# Patient Record
Sex: Female | Born: 1980 | Hispanic: Yes | Marital: Married | Smoking: Never smoker
Health system: Southern US, Community
[De-identification: ages and names within clinical notes are randomized; demographics above are authoritative.]

---

## 2004-11-02 ENCOUNTER — Ambulatory Visit: Payer: Self-pay | Admitting: Family Medicine

## 2005-06-24 ENCOUNTER — Inpatient Hospital Stay: Payer: Self-pay | Admitting: Obstetrics and Gynecology

## 2011-10-13 ENCOUNTER — Ambulatory Visit: Payer: Self-pay | Admitting: Family Medicine

## 2012-03-06 ENCOUNTER — Inpatient Hospital Stay: Payer: Self-pay

## 2012-03-06 LAB — CBC WITH DIFFERENTIAL/PLATELET
Basophil #: 0 10*3/uL (ref 0.0–0.1)
Basophil %: 0.1 %
Lymphocyte #: 1.5 10*3/uL (ref 1.0–3.6)
Lymphocyte %: 20.2 %
MCH: 31.6 pg (ref 26.0–34.0)
MCV: 94 fL (ref 80–100)
Monocyte #: 0.6 x10 3/mm (ref 0.2–0.9)
Monocyte %: 7.4 %
Neutrophil %: 71.8 %
Platelet: 194 10*3/uL (ref 150–440)
RBC: 3.69 10*6/uL — ABNORMAL LOW (ref 3.80–5.20)
RDW: 13.4 % (ref 11.5–14.5)

## 2015-01-26 NOTE — H&P (Signed)
L&D Evaluation:  History:   HPI 34 y/o G3P2002 @ 39+ [facility] 03/09/12 arrives with contractions becoming more regular this afternoon.Denies leaking fluid, small bloody show, baby moves well. Well pregnancy, care @ [facility] GBS negative.    Presents with contractions    Patient's Medical History No Chronic Illness    Patient's Surgical History none    Medications Pre Natal Vitamins    Allergies NKDA    Social History none    Family History Non-Contributory   ROS:   ROS All systems were reviewed.  HEENT, CNS, GI, GU, Respiratory, CV, Renal and Musculoskeletal systems were found to be normal.   Exam:   Vital Signs stable    Urine Protein not completed    General no apparent distress    Mental Status clear    Chest clear    Heart normal sinus rhythm    Abdomen gravid, non-tender    Estimated Fetal Weight Average for gestational age    Fetal Position vtx    Fundal Height term    Back no CVAT    Edema no edema    Reflexes 1+    Pelvic no external lesions, 5cm 50% vtx @ -3 BOWI bloody show present    Mebranes Intact    FHT normal rate with no decels, 130's 140's avg variabillity with accels    Fetal Heart Rate 136    Ucx regular, q 3/4 mins 60 sec moderate    Skin dry    Lymph no lymphadenopathy   Impression:   Impression early labor   Plan:   Plan monitor contractions and for cervical change    Comments Smiling through uc's, plans IV pain meds with labor progress, declines epidural.Knows what to expect 3rd baby. VSS AF FHR reactive. Ambulated x 1 hr, wishes to continue to ambulate as long as comfortable. Partner at bedside supportive.   Electronic Signatures: Rosie Fate (CNM)  (Signed 19-Jun-13 20:30)  Authored: L&D Evaluation   Last Updated: 19-Jun-13 20:30 by Rosie Fate (CNM)

## 2019-06-26 ENCOUNTER — Other Ambulatory Visit: Payer: Self-pay | Admitting: Otolaryngology

## 2019-06-26 DIAGNOSIS — H449 Unspecified disorder of globe: Secondary | ICD-10-CM

## 2019-06-27 ENCOUNTER — Other Ambulatory Visit: Payer: Self-pay | Admitting: Otolaryngology

## 2019-06-27 DIAGNOSIS — R0989 Other specified symptoms and signs involving the circulatory and respiratory systems: Secondary | ICD-10-CM

## 2019-07-02 ENCOUNTER — Other Ambulatory Visit: Payer: Self-pay

## 2019-07-02 ENCOUNTER — Ambulatory Visit
Admission: RE | Admit: 2019-07-02 | Discharge: 2019-07-02 | Disposition: A | Discharge status: Home / Self Care | Payer: BLUE CROSS/BLUE SHIELD | Source: Ambulatory Visit | Attending: Otolaryngology | Admitting: Otolaryngology

## 2019-07-02 DIAGNOSIS — R0989 Other specified symptoms and signs involving the circulatory and respiratory systems: Secondary | ICD-10-CM

## 2019-07-02 DIAGNOSIS — R09A2 Foreign body sensation, throat: Secondary | ICD-10-CM

## 2020-05-26 ENCOUNTER — Other Ambulatory Visit: Payer: Self-pay | Admitting: Family Medicine

## 2020-05-26 DIAGNOSIS — D249 Benign neoplasm of unspecified breast: Secondary | ICD-10-CM

## 2020-06-04 ENCOUNTER — Inpatient Hospital Stay: Admission: RE | Admit: 2020-06-04 | Payer: BLUE CROSS/BLUE SHIELD | Source: Ambulatory Visit

## 2020-06-04 ENCOUNTER — Other Ambulatory Visit: Payer: BLUE CROSS/BLUE SHIELD

## 2020-06-08 ENCOUNTER — Other Ambulatory Visit: Payer: Self-pay | Admitting: Family Medicine

## 2020-06-08 DIAGNOSIS — Z3480 Encounter for supervision of other normal pregnancy, unspecified trimester: Secondary | ICD-10-CM

## 2020-06-14 ENCOUNTER — Ambulatory Visit
Admission: RE | Admit: 2020-06-14 | Discharge: 2020-06-14 | Disposition: A | Discharge status: Home / Self Care | Payer: BLUE CROSS/BLUE SHIELD | Source: Ambulatory Visit | Attending: Family Medicine | Admitting: Family Medicine

## 2020-06-14 ENCOUNTER — Other Ambulatory Visit: Payer: Self-pay

## 2020-06-14 DIAGNOSIS — Z3480 Encounter for supervision of other normal pregnancy, unspecified trimester: Secondary | ICD-10-CM

## 2020-07-23 LAB — OB RESULTS CONSOLE RPR: RPR: NONREACTIVE

## 2020-07-23 LAB — OB RESULTS CONSOLE GC/CHLAMYDIA
Chlamydia: NEGATIVE
Gonorrhea: NEGATIVE

## 2020-07-23 LAB — OB RESULTS CONSOLE HIV ANTIBODY (ROUTINE TESTING): HIV: NONREACTIVE

## 2020-07-23 LAB — OB RESULTS CONSOLE HEPATITIS B SURFACE ANTIGEN: Hepatitis B Surface Ag: NEGATIVE

## 2020-07-23 LAB — OB RESULTS CONSOLE RUBELLA ANTIBODY, IGM: Rubella: IMMUNE

## 2020-07-23 LAB — OB RESULTS CONSOLE VARICELLA ZOSTER ANTIBODY, IGG: Varicella: IMMUNE

## 2020-08-20 ENCOUNTER — Other Ambulatory Visit: Payer: Self-pay | Admitting: Family Medicine

## 2020-08-20 DIAGNOSIS — Z3480 Encounter for supervision of other normal pregnancy, unspecified trimester: Secondary | ICD-10-CM

## 2020-09-13 ENCOUNTER — Ambulatory Visit: Payer: BLUE CROSS/BLUE SHIELD

## 2020-09-18 NOTE — L&D Delivery Note (Signed)
Delivery Note  First Stage: Labor onset: 0330 Augmentation: AROM Analgesia /Anesthesia intrapartum: Fentanyl IVPM AROM at 1555  Second Stage: Complete dilation at 1828 Onset of pushing at 1828 FHR second stage Cat I  Delivery of a viable female infant on 01/29/21 at [facility] by CNM delivery of fetal head in LOA position with restitution to LOT. No nuchal cord;  Anterior then posterior shoulders delivered easily with gentle downward traction. Baby placed on mom's chest, and attended to by peds.  Cord double clamped after cessation of pulsation, cut by patient.  Cord blood sample collected   Third Stage: Placenta delivered spontaneously intact with 3VC @ 1834 Placenta disposition: routine disposal Uterine tone Firm / bleeding small Prophylactic Pitocin bolus and cytotec given.   No vaginal, cervical, perineal lacerations identified  Repair n/a Est. Blood Loss (mL): 767  Complications: none  Mom to postpartum.  Baby to Couplet care / Skin to Skin.  Newborn: Birth Weight: pending  Apgar Scores: 8/9 Feeding planned: both

## 2020-09-28 ENCOUNTER — Other Ambulatory Visit: Payer: Self-pay | Admitting: Family Medicine

## 2020-09-28 DIAGNOSIS — Z3480 Encounter for supervision of other normal pregnancy, unspecified trimester: Secondary | ICD-10-CM

## 2020-09-30 ENCOUNTER — Ambulatory Visit
Admission: RE | Admit: 2020-09-30 | Discharge: 2020-09-30 | Disposition: A | Discharge status: Home / Self Care | Payer: BLUE CROSS/BLUE SHIELD | Source: Ambulatory Visit | Attending: Family Medicine | Admitting: Family Medicine

## 2020-09-30 ENCOUNTER — Other Ambulatory Visit: Payer: Self-pay

## 2020-09-30 DIAGNOSIS — Z3A22 22 weeks gestation of pregnancy: Secondary | ICD-10-CM | POA: Diagnosis not present

## 2020-09-30 DIAGNOSIS — Z3689 Encounter for other specified antenatal screening: Secondary | ICD-10-CM | POA: Diagnosis not present

## 2020-09-30 DIAGNOSIS — Z3480 Encounter for supervision of other normal pregnancy, unspecified trimester: Secondary | ICD-10-CM | POA: Insufficient documentation

## 2020-10-13 DIAGNOSIS — Z3480 Encounter for supervision of other normal pregnancy, unspecified trimester: Secondary | ICD-10-CM | POA: Diagnosis not present

## 2020-11-25 DIAGNOSIS — Z3480 Encounter for supervision of other normal pregnancy, unspecified trimester: Secondary | ICD-10-CM | POA: Diagnosis not present

## 2020-12-14 DIAGNOSIS — Z3480 Encounter for supervision of other normal pregnancy, unspecified trimester: Secondary | ICD-10-CM | POA: Diagnosis not present

## 2020-12-28 DIAGNOSIS — Z3480 Encounter for supervision of other normal pregnancy, unspecified trimester: Secondary | ICD-10-CM | POA: Diagnosis not present

## 2020-12-28 DIAGNOSIS — R350 Frequency of micturition: Secondary | ICD-10-CM | POA: Diagnosis not present

## 2021-01-04 DIAGNOSIS — Z3480 Encounter for supervision of other normal pregnancy, unspecified trimester: Secondary | ICD-10-CM | POA: Diagnosis not present

## 2021-01-06 LAB — OB RESULTS CONSOLE GBS: GBS: NEGATIVE

## 2021-01-11 DIAGNOSIS — Z3480 Encounter for supervision of other normal pregnancy, unspecified trimester: Secondary | ICD-10-CM | POA: Diagnosis not present

## 2021-01-18 DIAGNOSIS — Z3480 Encounter for supervision of other normal pregnancy, unspecified trimester: Secondary | ICD-10-CM | POA: Diagnosis not present

## 2021-01-25 DIAGNOSIS — Z3480 Encounter for supervision of other normal pregnancy, unspecified trimester: Secondary | ICD-10-CM | POA: Diagnosis not present

## 2021-01-29 ENCOUNTER — Inpatient Hospital Stay
Admission: EM | Admit: 2021-01-29 | Discharge: 2021-01-30 | DRG: 807 | Disposition: A | Discharge status: Home / Self Care | Payer: Medicaid Other | Attending: Obstetrics and Gynecology | Admitting: Obstetrics and Gynecology

## 2021-01-29 ENCOUNTER — Other Ambulatory Visit: Payer: Self-pay

## 2021-01-29 DIAGNOSIS — M549 Dorsalgia, unspecified: Secondary | ICD-10-CM | POA: Diagnosis not present

## 2021-01-29 DIAGNOSIS — Z20822 Contact with and (suspected) exposure to covid-19: Secondary | ICD-10-CM | POA: Diagnosis not present

## 2021-01-29 DIAGNOSIS — Z3A39 39 weeks gestation of pregnancy: Secondary | ICD-10-CM | POA: Diagnosis not present

## 2021-01-29 LAB — CBC
HCT: 36 % (ref 36.0–46.0)
Hemoglobin: 12.5 g/dL (ref 12.0–15.0)
MCH: 31.4 pg (ref 26.0–34.0)
MCHC: 34.7 g/dL (ref 30.0–36.0)
MCV: 90.5 fL (ref 80.0–100.0)
Platelets: 219 10*3/uL (ref 150–400)
RBC: 3.98 MIL/uL (ref 3.87–5.11)
RDW: 13.1 % (ref 11.5–15.5)
WBC: 7.1 10*3/uL (ref 4.0–10.5)
nRBC: 0 % (ref 0.0–0.2)

## 2021-01-29 LAB — RESP PANEL BY RT-PCR (FLU A&B, COVID) ARPGX2
Influenza A by PCR: NEGATIVE
Influenza B by PCR: NEGATIVE
SARS Coronavirus 2 by RT PCR: NEGATIVE

## 2021-01-29 LAB — TYPE AND SCREEN
ABO/RH(D): O POS
Antibody Screen: NEGATIVE

## 2021-01-29 LAB — ABO/RH: ABO/RH(D): O POS

## 2021-01-29 MED ORDER — SENNOSIDES-DOCUSATE SODIUM 8.6-50 MG PO TABS
2.0000 | ORAL_TABLET | Freq: Every day | ORAL | Status: DC
  Administered 2021-01-30: 2 via ORAL
  Filled 2021-01-29: qty 2

## 2021-01-29 MED ORDER — BENZOCAINE-MENTHOL 20-0.5 % EX AERO: 1.0000 "application " | INHALATION_SPRAY | CUTANEOUS | Status: DC | PRN

## 2021-01-29 MED ORDER — DIPHENHYDRAMINE HCL 25 MG PO CAPS: 25.0000 mg | ORAL_CAPSULE | Freq: Four times a day (QID) | ORAL | Status: DC | PRN

## 2021-01-29 MED ORDER — ONDANSETRON HCL 4 MG/2ML IJ SOLN: 4.0000 mg | INTRAMUSCULAR | Status: DC | PRN

## 2021-01-29 MED ORDER — OXYTOCIN BOLUS FROM INFUSION
333.0000 mL | Freq: Once | INTRAVENOUS | Status: AC
  Administered 2021-01-29: 333 mL via INTRAVENOUS

## 2021-01-29 MED ORDER — ACETAMINOPHEN 325 MG PO TABS: 650.0000 mg | ORAL_TABLET | ORAL | Status: DC | PRN

## 2021-01-29 MED ORDER — TERBUTALINE SULFATE 1 MG/ML IJ SOLN: 0.2500 mg | Freq: Once | INTRAMUSCULAR | Status: DC | PRN

## 2021-01-29 MED ORDER — ACETAMINOPHEN 325 MG PO TABS
650.0000 mg | ORAL_TABLET | ORAL | Status: DC | PRN
  Administered 2021-01-30: 650 mg via ORAL
  Filled 2021-01-29: qty 2

## 2021-01-29 MED ORDER — OXYTOCIN-SODIUM CHLORIDE 30-0.9 UT/500ML-% IV SOLN: 1.0000 m[IU]/min | INTRAVENOUS | Status: DC

## 2021-01-29 MED ORDER — SOD CITRATE-CITRIC ACID 500-334 MG/5ML PO SOLN: 30.0000 mL | ORAL | Status: DC | PRN

## 2021-01-29 MED ORDER — PRENATAL MULTIVITAMIN CH
1.0000 | ORAL_TABLET | Freq: Every day | ORAL | Status: DC
  Administered 2021-01-30: 1 via ORAL
  Filled 2021-01-29: qty 1

## 2021-01-29 MED ORDER — FERROUS SULFATE 325 (65 FE) MG PO TABS
325.0000 mg | ORAL_TABLET | Freq: Two times a day (BID) | ORAL | Status: DC
  Administered 2021-01-30 (×2): 325 mg via ORAL
  Filled 2021-01-29 (×2): qty 1

## 2021-01-29 MED ORDER — DIBUCAINE (PERIANAL) 1 % EX OINT: 1.0000 "application " | TOPICAL_OINTMENT | CUTANEOUS | Status: DC | PRN

## 2021-01-29 MED ORDER — MEDROXYPROGESTERONE ACETATE 150 MG/ML IM SUSP
150.0000 mg | INTRAMUSCULAR | Status: DC | PRN
  Filled 2021-01-29 (×2): qty 1

## 2021-01-29 MED ORDER — COCONUT OIL OIL: 1.0000 "application " | TOPICAL_OIL | Status: DC | PRN

## 2021-01-29 MED ORDER — FENTANYL CITRATE (PF) 100 MCG/2ML IJ SOLN
50.0000 ug | INTRAMUSCULAR | Status: DC | PRN
  Administered 2021-01-29: 100 ug via INTRAVENOUS
  Filled 2021-01-29: qty 2

## 2021-01-29 MED ORDER — ONDANSETRON HCL 4 MG PO TABS: 4.0000 mg | ORAL_TABLET | ORAL | Status: DC | PRN

## 2021-01-29 MED ORDER — LIDOCAINE HCL (PF) 1 % IJ SOLN: 30.0000 mL | INTRAMUSCULAR | Status: DC | PRN

## 2021-01-29 MED ORDER — LACTATED RINGERS IV SOLN: INTRAVENOUS | Status: DC

## 2021-01-29 MED ORDER — IBUPROFEN 600 MG PO TABS
600.0000 mg | ORAL_TABLET | Freq: Four times a day (QID) | ORAL | Status: DC
  Administered 2021-01-30 (×3): 600 mg via ORAL
  Filled 2021-01-29 (×3): qty 1

## 2021-01-29 MED ORDER — MISOPROSTOL 200 MCG PO TABS
ORAL_TABLET | ORAL | Status: AC
  Filled 2021-01-29: qty 4

## 2021-01-29 MED ORDER — ZOLPIDEM TARTRATE 5 MG PO TABS: 5.0000 mg | ORAL_TABLET | Freq: Every evening | ORAL | Status: DC | PRN

## 2021-01-29 MED ORDER — WITCH HAZEL-GLYCERIN EX PADS: 1.0000 "application " | MEDICATED_PAD | CUTANEOUS | Status: DC | PRN

## 2021-01-29 MED ORDER — LACTATED RINGERS IV SOLN
500.0000 mL | INTRAVENOUS | Status: DC | PRN
Start: 2021-01-29 — End: 2021-01-29

## 2021-01-29 MED ORDER — OXYTOCIN-SODIUM CHLORIDE 30-0.9 UT/500ML-% IV SOLN
2.5000 [IU]/h | INTRAVENOUS | Status: DC
  Filled 2021-01-29: qty 500

## 2021-01-29 MED ORDER — SIMETHICONE 80 MG PO CHEW: 80.0000 mg | CHEWABLE_TABLET | ORAL | Status: DC | PRN

## 2021-01-29 MED ORDER — ONDANSETRON HCL 4 MG/2ML IJ SOLN: 4.0000 mg | Freq: Four times a day (QID) | INTRAMUSCULAR | Status: DC | PRN

## 2021-01-29 MED ORDER — IBUPROFEN 600 MG PO TABS
600.0000 mg | ORAL_TABLET | Freq: Four times a day (QID) | ORAL | Status: DC
  Administered 2021-01-29: 600 mg via ORAL
  Filled 2021-01-29: qty 1

## 2021-01-29 NOTE — Progress Notes (Signed)
Labor Progress Note  Gabriela Collins is a 40 y.o. [phone] at [redacted]w[redacted]d by ultrasound admitted for active labor  Subjective: feeling more painful UCs.   Objective: BP 108/68 (BP Location: Left Arm)   Pulse 69   Temp 98.4 F (36.9 C) (Oral)   Resp 17   LMP 04/29/2020  Notable VS details: reviewed.   Fetal Assessment: FHT:  FHR: 145 bpm, variability: moderate,  accelerations:  Present,  decelerations:  Present variable decels noted.   Intermittent monitoring, now with Variables, will continuous monitor.  Category/reactivity:  Category II UC:   regular, every 2-3 minutes SVE:   6/90/-1 with BBOW.  - AROM performed, moderate meconium noted. Normal bloody show.  Membrane status: AROM at 1555 Amniotic color: Moderate meconium  Labs: Lab Results  Component Value Date   WBC 7.1 01/29/2021   HGB 12.5 01/29/2021   HCT 36.0 01/29/2021   MCV 90.5 01/29/2021   PLT 219 01/29/2021    Assessment / Plan: Spontaneous labor, progressing normally  Labor: Progressing normally, AROM performed.  Preeclampsia:  no e/o Pre-E Fetal Wellbeing:  Category II Pain Control:  Labor support without medications I/D:  GBS neg Anticipated MOD:  [place], CNM 01/29/2021, 3:58 PM

## 2021-01-29 NOTE — Discharge Summary (Signed)
Obstetrical Discharge Summary  Patient Name: Gabriela Collins DOB: 1981-03-01 MRN: [id number]  Date of Admission: 01/29/2021 Date of Delivery: 01/29/21 Delivered by: Magda Kiel CNM Date of Discharge: 01/30/2021  Primary OB: [facility]  [id number] last menstrual period was 04/29/2020. EDC Estimated Date of Delivery: 02/03/21 Gestational Age at Delivery: [redacted]w[redacted]d   Antepartum complications:  1. Advanced maternal age  Admitting Diagnosis: active labor Secondary Diagnosis: SVD Patient Active Problem List   Diagnosis Date Noted  . NSVD (normal spontaneous vaginal delivery) 01/30/2021    Augmentation: AROM Complications: None Intrapartum complications/course: see delivery note Date of Delivery: 01/29/21 Delivered By: Hassan Buckler CNM Delivery Type: spontaneous vaginal delivery Anesthesia: epidural Placenta: spontaneous Laceration: none Episiotomy: none Newborn Data: Live born female "[name]" Birth Weight:  3470g 7lb 10.4oz APGAR: 8, 9  Newborn Delivery   Birth date/time: 01/29/2021 18:29:00 Delivery type: Vaginal, Spontaneous     Postpartum Procedures: none  Edinburgh:  Edinburgh Postnatal Depression Scale Screening Tool 01/30/2021 01/29/2021  I have been able to laugh and see the funny side of things. 0 (No Data)  I have looked forward with enjoyment to things. 0 -  I have blamed myself unnecessarily when things went wrong. 0 -  I have been anxious or worried for no good reason. 1 -  I have felt scared or panicky for no good reason. 0 -  Things have been getting on top of me. 0 -  I have been so unhappy that I have had difficulty sleeping. 0 -  I have felt sad or miserable. 0 -  I have been so unhappy that I have been crying. 0 -  The thought of harming myself has occurred to me. 0 -  Edinburgh Postnatal Depression Scale Total 1 -      Post partum course:  Patient had an uncomplicated postpartum course.  By time of discharge on PPD#1, her pain was controlled on oral pain  medications; she had appropriate lochia and was ambulating, voiding without difficulty and tolerating regular diet.  She was deemed stable for discharge to home.     Discharge Physical Exam:  BP 112/66 (BP Location: Right Arm)   Pulse 67   Temp 98.2 F (36.8 C) (Oral)   Resp 18   Ht 5\' 1"  (1.549 m)   Wt 78 kg   LMP 04/29/2020   SpO2 97%   Breastfeeding Unknown   BMI 32.50 kg/m   General: NAD CV: RRR Pulm: CTABL, nl effort ABD: s/nd/nt, fundus firm and below the umbilicus Lochia: moderate Perineum: intact DVT Evaluation: LE non-ttp, no evidence of DVT on exam.  Hemoglobin  Date Value Ref Range Status  01/30/2021 11.7 (L) 12.0 - 15.0 g/dL Final   HGB  Date Value Ref Range Status  03/06/2012 11.7 (L) 12.0 - 16.0 g/dL Final   HCT  Date Value Ref Range Status  01/30/2021 34.0 (L) 36.0 - 46.0 % Final  03/07/2012 34.0 (L) 35.0 - 47.0 % Final     Disposition: stable, discharge to home. Baby Feeding: breastmilk and formula Baby Disposition: home with mom  Rh Immune globulin given: n/a Rubella vaccine given: immune Varicella vaccine given: immune Tdap vaccine given in AP or PP setting: AP 10/20/20 Flu vaccine given in AP or PP setting: AP 07/2020  Contraception: Depo  Prenatal Labs:  Blood type/Rh  O Pos  Antibody screen neg  Rubella Immune  Varicella Immune  RPR NR  HBsAg Neg  HIV NR  GC neg  Chlamydia neg  Genetic screening  Not done  1 hour GTT 132  3 hour GTT   GBS  Neg      Plan:  Gabriela Collins was discharged to home in good condition. Follow-up appointment with delivering provider in 6 weeks.  Discharge Medications: Allergies as of 01/30/2021   No Known Allergies     Medication List    TAKE these medications   prenatal multivitamin Tabs tablet Take 1 tablet by mouth daily at 12 noon.        Follow-up [place], [facility]. Schedule an appointment as soon as possible for a visit in 6 week(s).    Specialty: General Practice Contact information: [address] [place] [address] [phone]               Signed:  Clydene Laming, CNM 01/30/2021  5:22 PM

## 2021-01-29 NOTE — H&P (Signed)
OB History & Physical   History of Present Illness:  Chief Complaint: back pain, vaginal bleeding  HPI:  Gabriela Collins is a 40 y.o. G69P3003 female at [redacted]w[redacted]d dated by [place] at [redacted]w[redacted]d.  She presents to L&D for spotting and irreg contractions.   Active FM, Irreg UCs, spotting and denies LOF.    Pregnancy Issues: 1. Language barrier 2. Advanced maternal age    Maternal Medical History:  History reviewed. No pertinent past medical history.  History reviewed. No pertinent surgical history.  Not on File  Prior to Admission medications   Not on File   OB History  Gravida Para Term Preterm AB Living  4 3 3     3   SAB IAB Ectopic Multiple Live Births          3    # Outcome Date GA Lbr Len/2nd Weight Sex Delivery Anes PTL Lv  4 Current           3 Term 03/06/12    M Vag-Spont   LIV  2 Term 06/24/05    M Vag-Spont   LIV  1 Term 01/05/99    F Vag-Spont   LIV     Prenatal care site: [facility]  Social History: She  reports that she has never smoked. She has never used smokeless tobacco. She reports that she does not drink alcohol and does not use drugs.  Family History: no pertinent family Hx  Review of Systems: A full review of systems was performed and negative except as noted in the HPI.     Physical Exam:  Vital Signs: LMP 04/29/2020  General: no acute distress.  HEENT: normocephalic, atraumatic Heart: regular rate & rhythm.  No murmurs/rubs/gallops Lungs: clear to auscultation bilaterally, normal respiratory effort Abdomen: soft, gravid, non-tender;  EFW: 7.5lbs Pelvic:   External: Normal external female genitalia  Cervix: 5cm per nursing exam.    Extremities: non-tender, symmetric, no edema bilaterally.  DTRs: 2+  Neurologic: Alert & oriented x 3.    No results found for this or any previous visit (from the past 24 hour(s)).  Pertinent Results:  Prenatal Labs: Blood type/Rh  O Pos  Antibody screen neg  Rubella Immune  Varicella Immune  RPR NR  HBsAg  Neg  HIV NR  GC neg  Chlamydia neg  Genetic screening Not done  1 hour GTT 132  3 hour GTT   GBS  Neg   FHT: 140bpm, moderate variability, + accels, no decels.   TOCO: UI and T0-2IOX   Cephalic by leopolds/exam  No results found.  Assessment:  Gabriela Collins is a 40 y.o. [phone] female at [redacted]w[redacted]d with active labor.   Plan:  1. Admit to Labor & Delivery; consents reviewed and obtained - COVID swab on admit.   2. Fetal Well being  - Fetal Tracing: Cat I tracing - Group B Streptococcus ppx indicated: negative - Presentation: cephalic confirmed by exam and Leopolds.    3. Routine OB: - Prenatal labs reviewed, as above - Rh  O pos.  - CBC, T&S, RPR on admit - Clear fluids, IVF  4. Monitoring of Labor -  Contractions: external toco in place -  Pelvis proven to 8lbs -  Plan for induction with AROM, Pitocin if needed.  -  Plan for continuous fetal monitoring  -  Maternal pain control as desired - Anticipate vaginal delivery  5. Post Partum Planning: - Infant feeding: both - Contraception: Depo - Flu: 07/2020 - Tdap: 11/09/20  per prenatal notes.   Francetta Found, CNM 01/29/21 1:40 PM

## 2021-01-30 LAB — CBC
HCT: 34 % — ABNORMAL LOW (ref 36.0–46.0)
Hemoglobin: 11.7 g/dL — ABNORMAL LOW (ref 12.0–15.0)
MCH: 31.1 pg (ref 26.0–34.0)
MCHC: 34.4 g/dL (ref 30.0–36.0)
MCV: 90.4 fL (ref 80.0–100.0)
Platelets: 194 10*3/uL (ref 150–400)
RBC: 3.76 MIL/uL — ABNORMAL LOW (ref 3.87–5.11)
RDW: 13.2 % (ref 11.5–15.5)
WBC: 8.7 10*3/uL (ref 4.0–10.5)
nRBC: 0 % (ref 0.0–0.2)

## 2021-01-30 LAB — RPR: RPR Ser Ql: NONREACTIVE

## 2021-01-30 NOTE — Lactation Note (Signed)
This note was copied from a baby's chart. Lactation Consultation Note  Patient Name: Gabriela Collins [id number] Date: 01/30/2021   Age:39 hours  Maternal Data    Feeding   Observed excellent breast feed with [name] latching with minimal assistance and good pillow support.  He began strong, rhythmic sucking with occasional audible swallows.  Mom was concerned earlier that he was not getting enough milk until lactation demonstrated how much mom could hand express colostrum.  Mom denies any breast or nipple pain.  Discussed how important breast milk is to give her baby and to rub some on her nipples to prevent bacteria, lubricate and help with tenderness.  Mom reports breast feeding her other 3 children for 8 months to a year without any challenges.  Mom does not speak english.  Hand outs given in spanish on what to expect with breast feeding the first 4 days of life reviewing through interpreter normal newborn stomach size, feeding cues, normal course of lactation, adequate intake and out put, supply and demand and routine newborn feeding patterns.  Lactation Geophysicist/field seismologist about support from [employer].  Lactation name and number written on white board and encouraged to call with any questions, concerns or assistance.   LATCH Score                    Lactation Tools Discussed/Used    Interventions    Discharge    Consult Status      [name] 01/30/2021, 8:12 PM

## 2021-01-30 NOTE — Progress Notes (Signed)
Discharge instructions given. Pt was questioning about f/u appt with baby pediatrician and desire to go to [place] where they are not currently being seen with other children and this nurse reinnerated that as long as they were going to be able to get a newborn appt with [facility] in 2 days they could go to the pediatrician of their choice. Other children currently go to [facility] and Mother and father stated they were going to just call to f/u there. No other needs or concerns expressed. Hugs removed and security band checked and verified.

## 2021-01-30 NOTE — Progress Notes (Signed)
Pt concerned about milk supply and the lack there of. Interpreter Mariela utilized # [phone] to educate pt of baby belly size and putting baby to breast when giving cues to stimulate breasts and milk production. Pt verbalized understanding.

## 2021-01-30 NOTE — Progress Notes (Signed)
Pt discharged home via [facility] per Cyril Mourning, RN

## 2021-01-30 NOTE — Progress Notes (Signed)
Post Partum Day 1  Subjective: Doing well, no concerns. Ambulating without difficulty, pain managed with PO meds, tolerating regular diet, and voiding without difficulty.   No fever/chills, chest pain, shortness of breath, nausea/vomiting, or leg pain. No nipple or breast pain.   Objective: BP 101/64 (BP Location: Right Arm)   Pulse 73   Temp 97.9 F (36.6 C) (Oral)   Resp 18   Ht 5\' 1"  (1.549 m)   Wt 78 kg   LMP 04/29/2020   SpO2 96%   Breastfeeding Unknown   BMI 32.50 kg/m    Physical Exam:  General: alert and cooperative Breasts: soft/nontender CV: RRR Pulm: nl effort Abdomen: soft, non-tender Uterine Fundus: firm Incision: n/a Perineum: minimal edema, intact Lochia: appropriate DVT Evaluation: No evidence of DVT seen on physical exam. Edinburgh:  Edinburgh Postnatal Depression Scale Screening Tool 01/29/2021  I have been able to laugh and see the funny side of things. (No Data)     Recent Labs    01/29/21 1338 01/30/21 0534  HGB 12.5 11.7*  HCT 36.0 34.0*  WBC 7.1 8.7  PLT 219 194    Assessment/Plan: 40 y.o. G4P4004 postpartum day # 1  -Continue routine postpartum care -Lactation consult PRN for breastfeeding   -Immunization status:  all immunizations up to date  Disposition: Continue inpatient postpartum care    LOS: 1 day   Laurajean Hosek, CNM 01/30/2021, 9:08 AM

## 2021-01-31 ENCOUNTER — Telehealth: Payer: Self-pay

## 2021-01-31 NOTE — Telephone Encounter (Signed)
Transition Care Management Follow-up Telephone Call  Date of discharge and from where: 01/30/2021 from [facility]  How have you been since you were released from the hospital? Spoke to pt and pt dtr. Pt stated that she is overall feeling good today. Pt stated that she is having some back pain and bilateral swelling in her feet and hands.   Any questions or concerns? No  Items Reviewed:  Did the pt receive and understand the discharge instructions provided? Yes   Medications obtained and verified? Yes   Other? No   Any new allergies since your discharge? No   Dietary orders reviewed? n/a  Do you have support at home? Yes   Functional Questionnaire: (I = Independent and D = Dependent) ADLs: I  Bathing/Dressing- I  Meal Prep- I  Eating- I  Maintaining continence- I  Transferring/Ambulation- I  Managing Meds- I   Follow up appointments reviewed:   PCP Hospital f/u appt confirmed? No    Specialist Hospital f/u appt confirmed? No    Are transportation arrangements needed? No   If their condition worsens, is the pt aware to call PCP or go to the Emergency Dept.? Yes  Was the patient provided with contact information for the PCP's office or ED? Yes  Was to pt encouraged to call back with questions or concerns? Yes

## 2021-02-24 DIAGNOSIS — R059 Cough, unspecified: Secondary | ICD-10-CM | POA: Diagnosis not present

## 2021-03-08 DIAGNOSIS — R519 Headache, unspecified: Secondary | ICD-10-CM | POA: Diagnosis not present

## 2021-03-08 DIAGNOSIS — G5600 Carpal tunnel syndrome, unspecified upper limb: Secondary | ICD-10-CM | POA: Diagnosis not present

## 2021-03-22 DIAGNOSIS — M654 Radial styloid tenosynovitis [de Quervain]: Secondary | ICD-10-CM | POA: Diagnosis not present

## 2021-03-22 DIAGNOSIS — Z712 Person consulting for explanation of examination or test findings: Secondary | ICD-10-CM | POA: Diagnosis not present

## 2021-03-22 DIAGNOSIS — Z1389 Encounter for screening for other disorder: Secondary | ICD-10-CM | POA: Diagnosis not present

## 2021-04-12 DIAGNOSIS — Z0000 Encounter for general adult medical examination without abnormal findings: Secondary | ICD-10-CM | POA: Diagnosis not present

## 2021-04-12 DIAGNOSIS — Z1389 Encounter for screening for other disorder: Secondary | ICD-10-CM | POA: Diagnosis not present

## 2021-04-12 DIAGNOSIS — Z712 Person consulting for explanation of examination or test findings: Secondary | ICD-10-CM | POA: Diagnosis not present

## 2021-04-12 DIAGNOSIS — Z130 Encounter for screening for diseases of the blood and blood-forming organs and certain disorders involving the immune mechanism: Secondary | ICD-10-CM | POA: Diagnosis not present

## 2021-05-09 DIAGNOSIS — G5600 Carpal tunnel syndrome, unspecified upper limb: Secondary | ICD-10-CM | POA: Diagnosis not present

## 2021-05-09 DIAGNOSIS — M654 Radial styloid tenosynovitis [de Quervain]: Secondary | ICD-10-CM | POA: Diagnosis not present

## 2021-05-30 DIAGNOSIS — Z0000 Encounter for general adult medical examination without abnormal findings: Secondary | ICD-10-CM | POA: Diagnosis not present

## 2021-05-30 DIAGNOSIS — B349 Viral infection, unspecified: Secondary | ICD-10-CM | POA: Diagnosis not present

## 2021-05-30 DIAGNOSIS — E559 Vitamin D deficiency, unspecified: Secondary | ICD-10-CM | POA: Diagnosis not present

## 2021-08-01 DIAGNOSIS — R110 Nausea: Secondary | ICD-10-CM | POA: Diagnosis not present

## 2021-08-01 DIAGNOSIS — B349 Viral infection, unspecified: Secondary | ICD-10-CM | POA: Diagnosis not present

## 2021-08-01 DIAGNOSIS — Z3200 Encounter for pregnancy test, result unknown: Secondary | ICD-10-CM | POA: Diagnosis not present

## 2021-08-01 DIAGNOSIS — E559 Vitamin D deficiency, unspecified: Secondary | ICD-10-CM | POA: Diagnosis not present

## 2021-12-19 DIAGNOSIS — E663 Overweight: Secondary | ICD-10-CM | POA: Diagnosis not present

## 2021-12-19 DIAGNOSIS — Z3200 Encounter for pregnancy test, result unknown: Secondary | ICD-10-CM | POA: Diagnosis not present

## 2021-12-19 DIAGNOSIS — E559 Vitamin D deficiency, unspecified: Secondary | ICD-10-CM | POA: Diagnosis not present

## 2021-12-19 DIAGNOSIS — Z0000 Encounter for general adult medical examination without abnormal findings: Secondary | ICD-10-CM | POA: Diagnosis not present

## 2021-12-26 DIAGNOSIS — J069 Acute upper respiratory infection, unspecified: Secondary | ICD-10-CM | POA: Diagnosis not present

## 2021-12-26 DIAGNOSIS — Z0000 Encounter for general adult medical examination without abnormal findings: Secondary | ICD-10-CM | POA: Diagnosis not present

## 2022-01-11 DIAGNOSIS — L719 Rosacea, unspecified: Secondary | ICD-10-CM | POA: Diagnosis not present

## 2022-01-11 DIAGNOSIS — B079 Viral wart, unspecified: Secondary | ICD-10-CM | POA: Diagnosis not present

## 2022-01-11 DIAGNOSIS — Z0000 Encounter for general adult medical examination without abnormal findings: Secondary | ICD-10-CM | POA: Diagnosis not present

## 2022-01-11 DIAGNOSIS — R1013 Epigastric pain: Secondary | ICD-10-CM | POA: Diagnosis not present

## 2022-01-12 DIAGNOSIS — R1013 Epigastric pain: Secondary | ICD-10-CM | POA: Diagnosis not present

## 2022-01-15 IMAGING — US US OB < 14 WEEKS - US OB TV
1 series · 14 of 28 positions shown · non-contrast
Comparison: None

CLINICAL DATA: First trimester pregnancy, dating, LMP sometime in
early [REDACTED] but uncertain

EXAM:
OBSTETRIC <14 WK US AND TRANSVAGINAL OB US
TECHNIQUE: Both transabdominal and transvaginal ultrasound examinations were
performed for complete evaluation of the gestation as well as the
maternal uterus, adnexal regions, and pelvic cul-de-sac.
Transvaginal technique was performed to assess early pregnancy.

[Series 1: us ob < 14 weeks - us ob tv · 0.28mm/px · 14 of 84 slices shown]
[im 4/84]
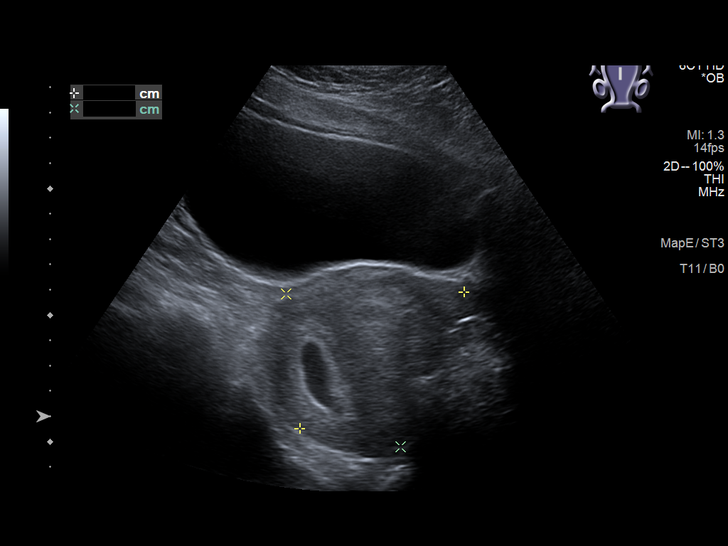
[im 10/84]
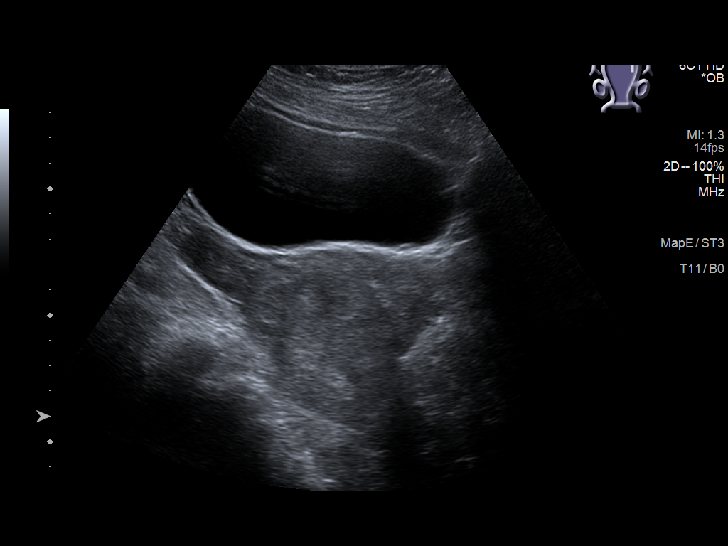
[im 16/84]
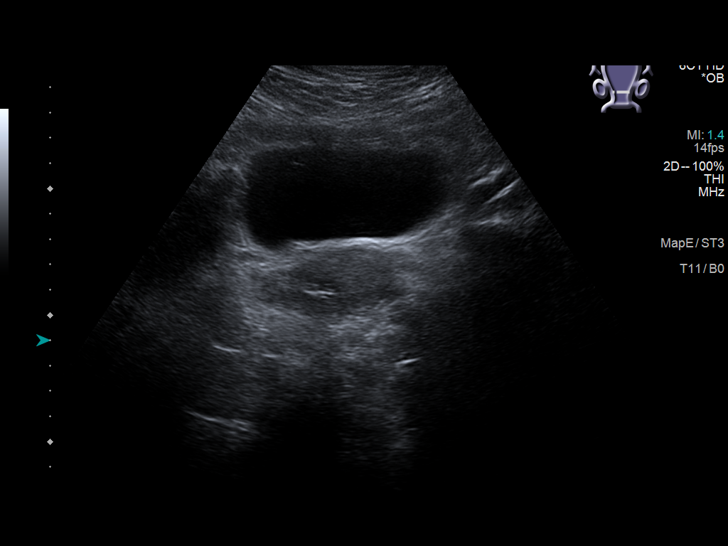
[im 22/84]
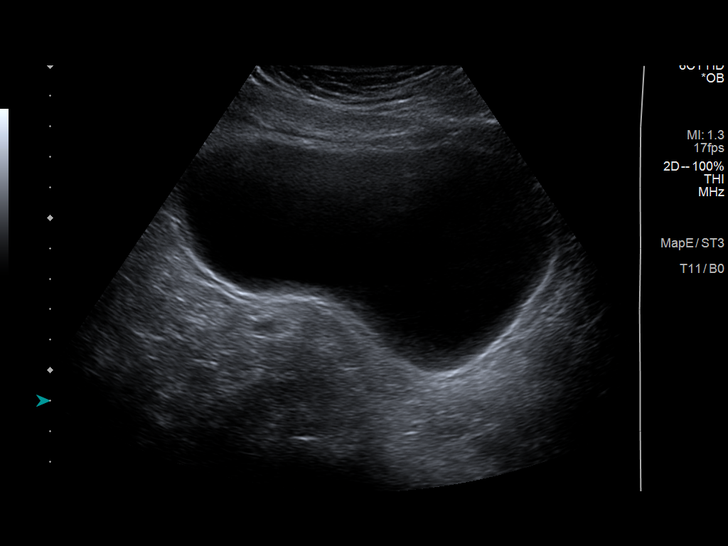
[im 28/84]
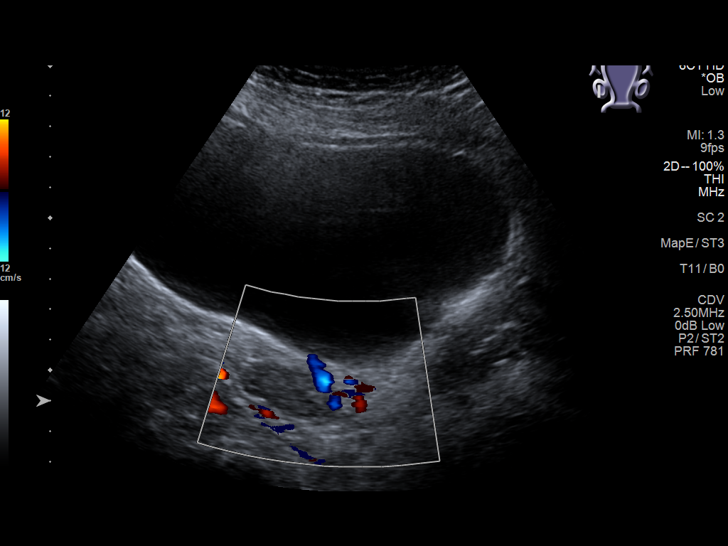
[im 34/84]
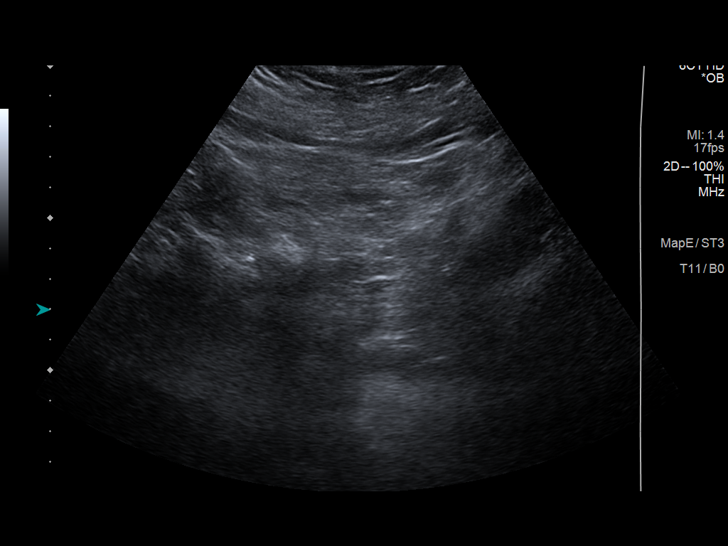
[im 40/84]
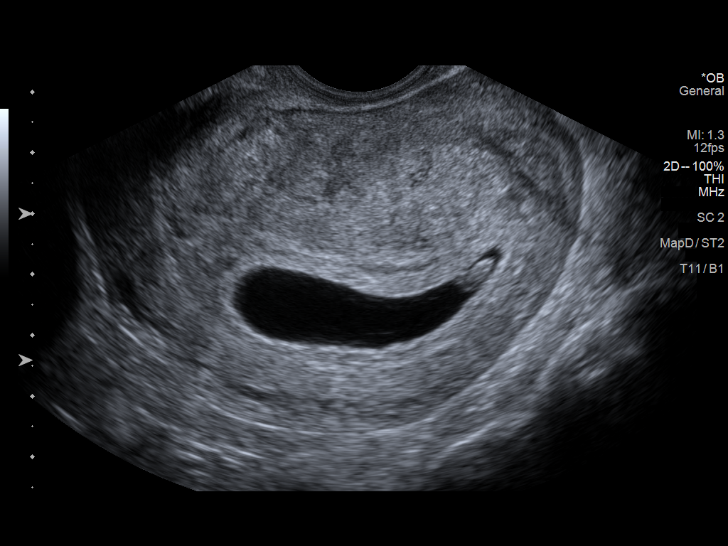
[im 47/84]
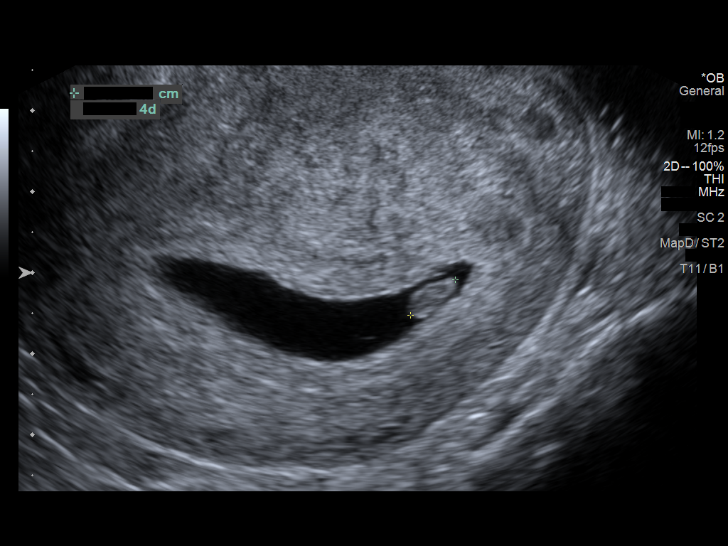
[im 53/84]
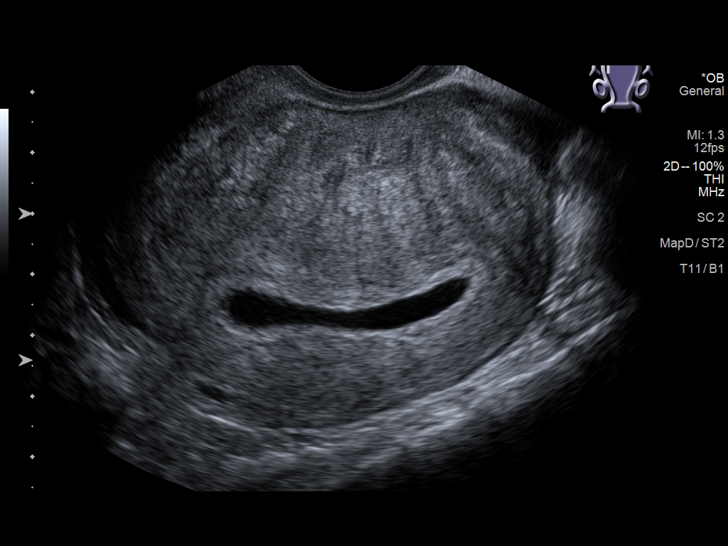
[im 59/84]
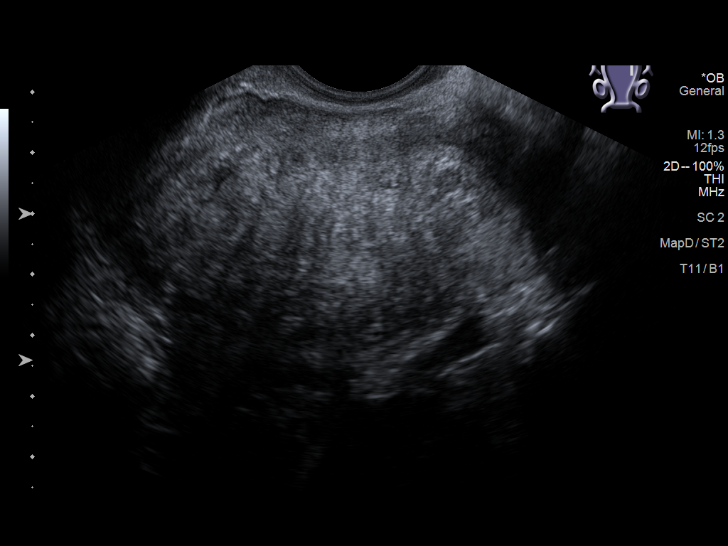
[im 65/84]
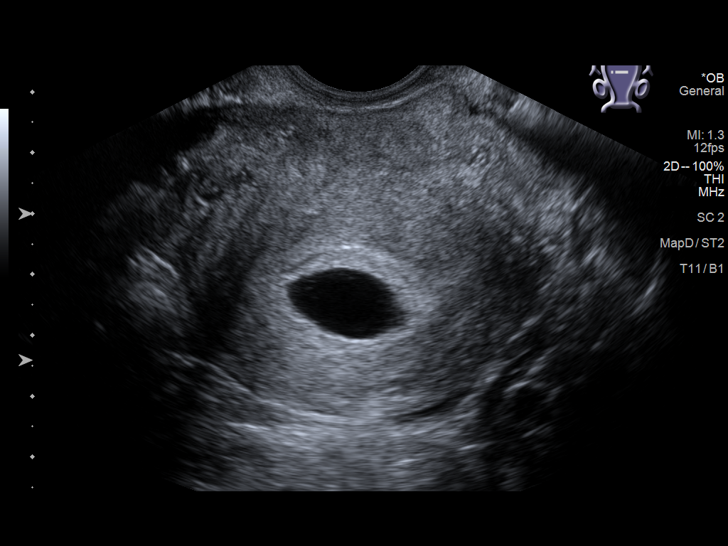
[im 71/84]
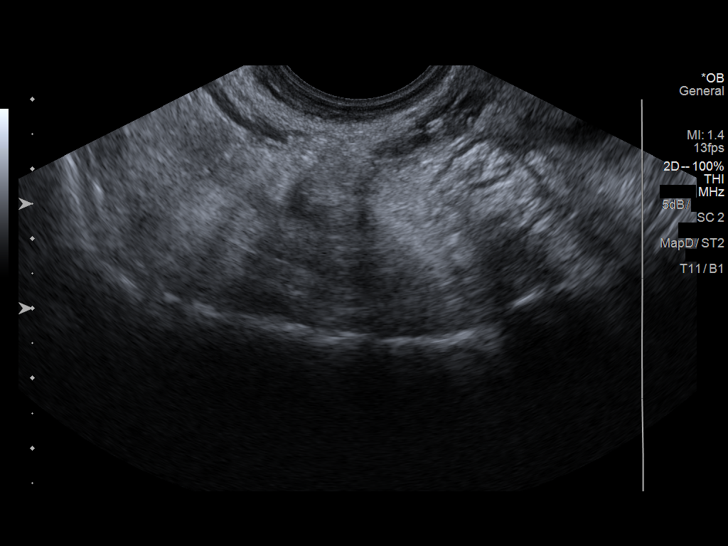
[im 77/84]
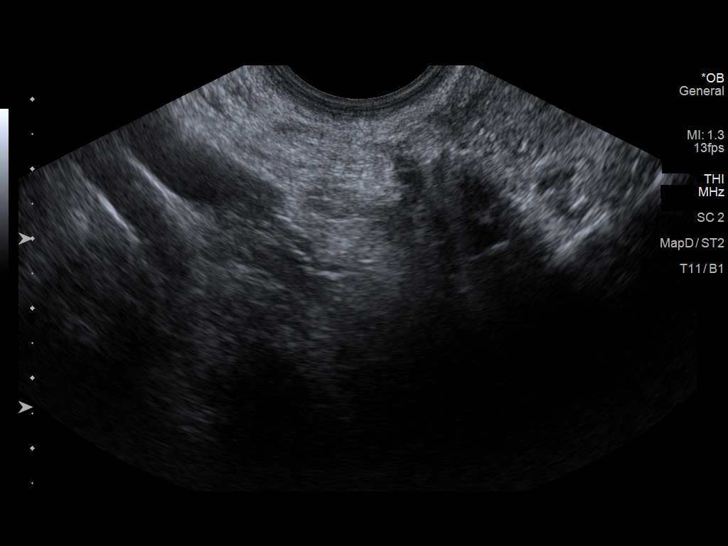
[im 84/84]
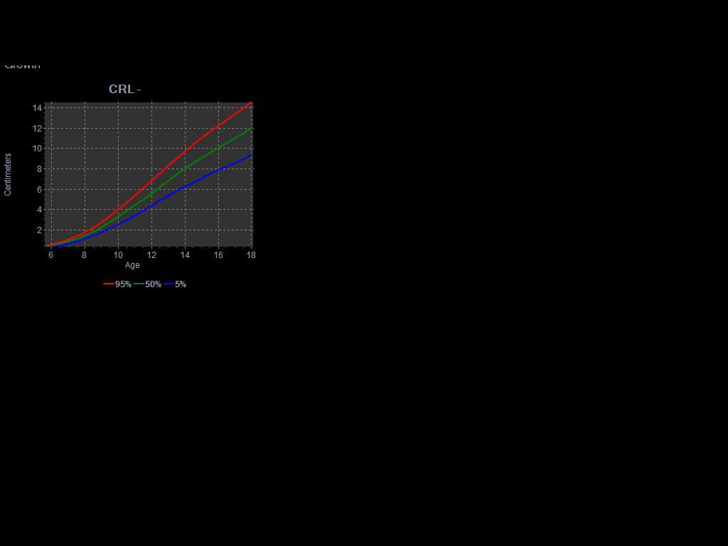

[14 of 28 positions shown; findings below may reference images not displayed]

FINDINGS: Intrauterine gestational sac: Present, single

Yolk sac:  Present

Embryo:  Present

Cardiac Activity: Present

Heart Rate: 123 bpm

CRL:  7.1 mm   6 w   4 d                  US EDC: 02/03/2021

Subchorionic hemorrhage:  None visualized.

Maternal uterus/adnexae:

Maternal uterus otherwise unremarkable.

RIGHT ovary normal size and morphology, 3.5 x 2.0 x 1.7 cm.

LEFT ovary normal size and morphology 3.5 x 1.8 x 1.7 cm.

No free pelvic fluid or adnexal masses.
IMPRESSION: Single live intrauterine gestation at 6 weeks 4 days EGA by
crown-rump length.

No acute abnormalities.

## 2022-03-07 DIAGNOSIS — Z1389 Encounter for screening for other disorder: Secondary | ICD-10-CM | POA: Diagnosis not present

## 2022-03-07 DIAGNOSIS — Z309 Encounter for contraceptive management, unspecified: Secondary | ICD-10-CM | POA: Diagnosis not present

## 2022-03-29 ENCOUNTER — Telehealth: Payer: Self-pay

## 2022-03-29 NOTE — Telephone Encounter (Signed)
Using interpreter Jonelle Sidle [phone]- attempted to call pt but unable to LM.  PCP listed [facility].

## 2022-05-03 IMAGING — US US OB COMP +14 WK
1 series · 13 of 28 positions shown · non-contrast
Comparison: none

CLINICAL DATA: Second trimester pregnancy for fetal anatomy survey.

EXAM:
OBSTETRICAL ULTRASOUND >14 WKS

[Series 1: us ob comp +14 wk · 0.26mm/px · 13 of 105 slices shown]
[im 4/105]
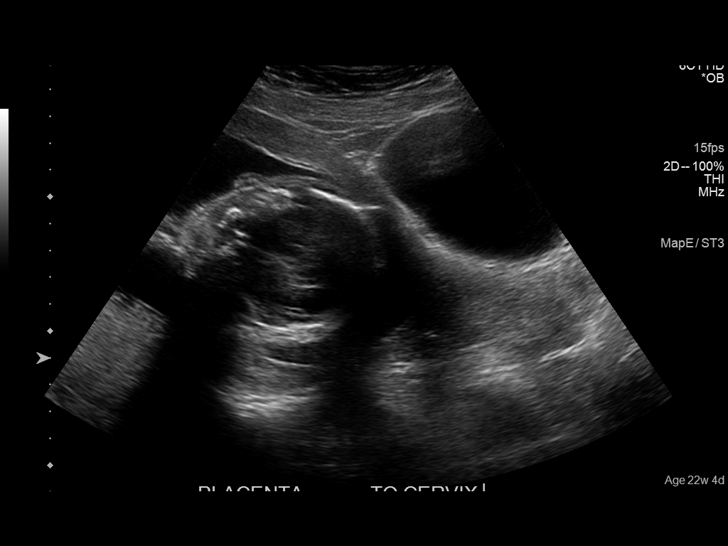
[im 12/105]
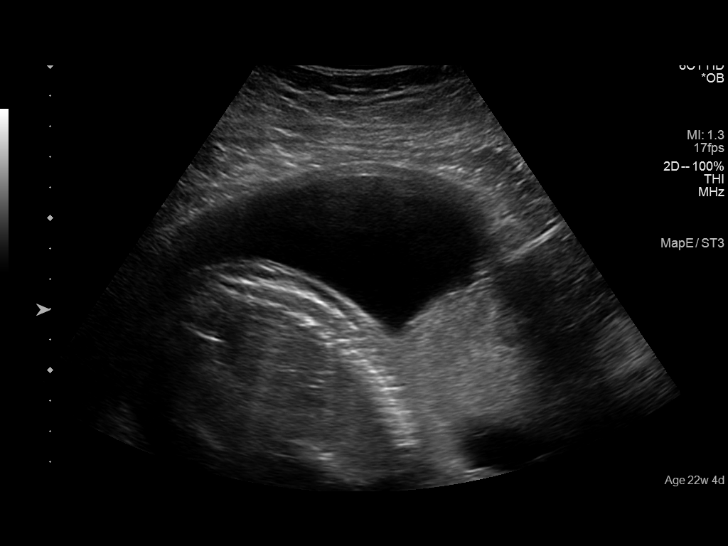
[im 20/105]
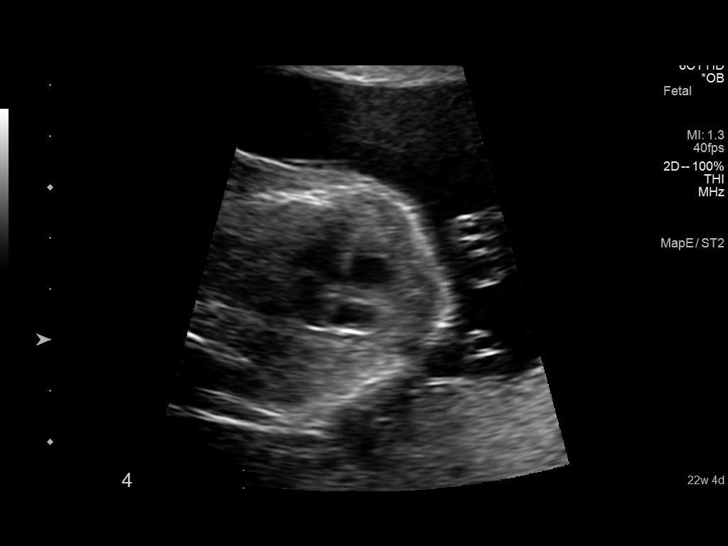
[im 27/105]
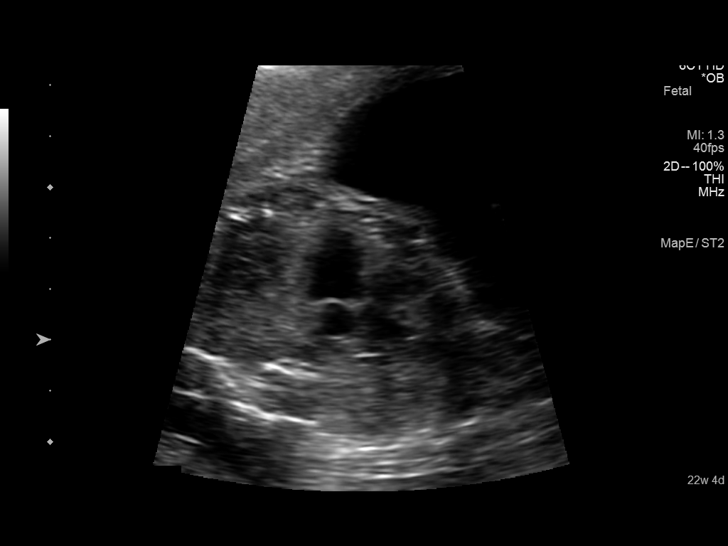
[im 35/105]
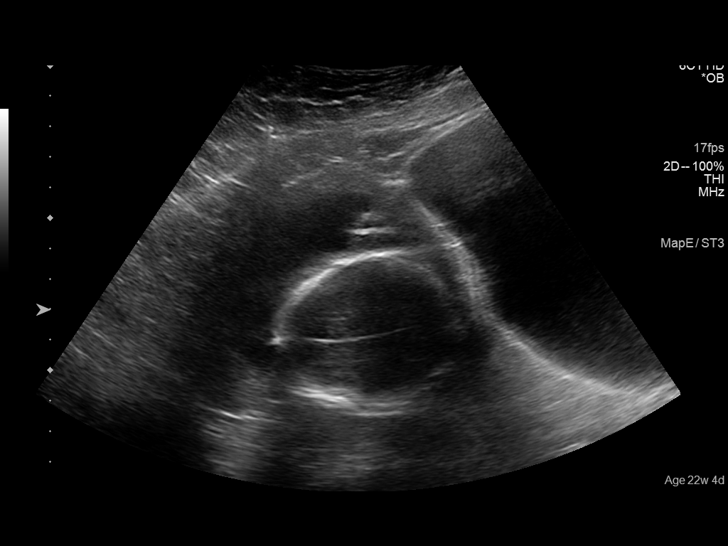
[im 43/105]
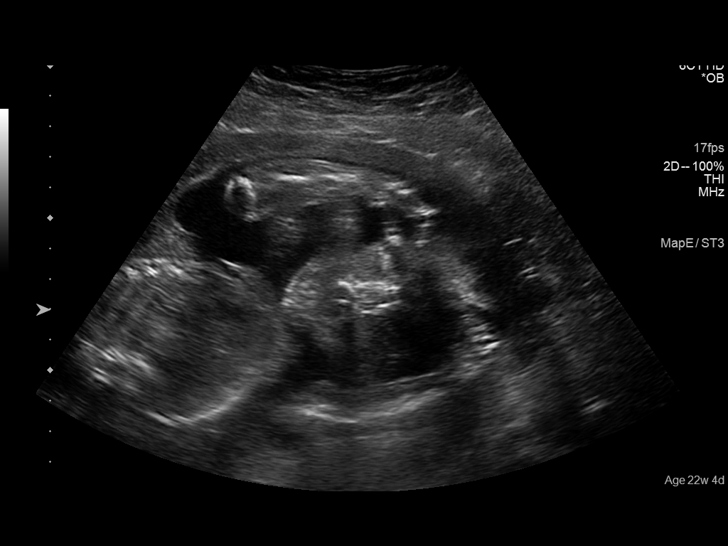
[im 54/105]
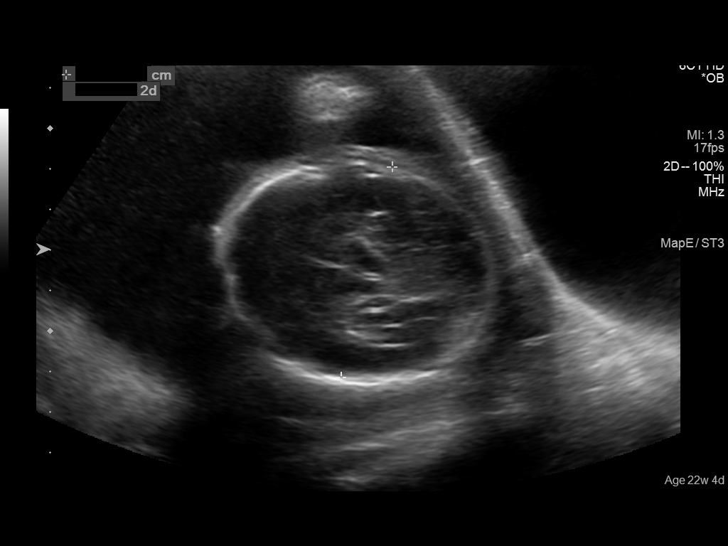
[im 62/105]
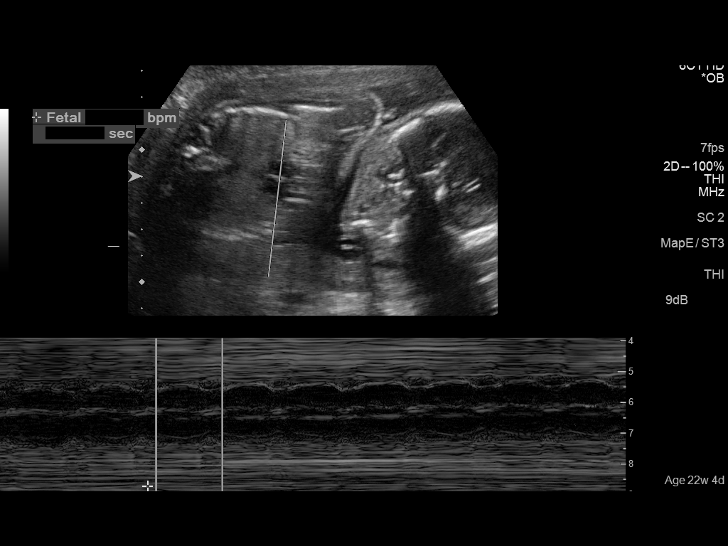
[im 70/105]
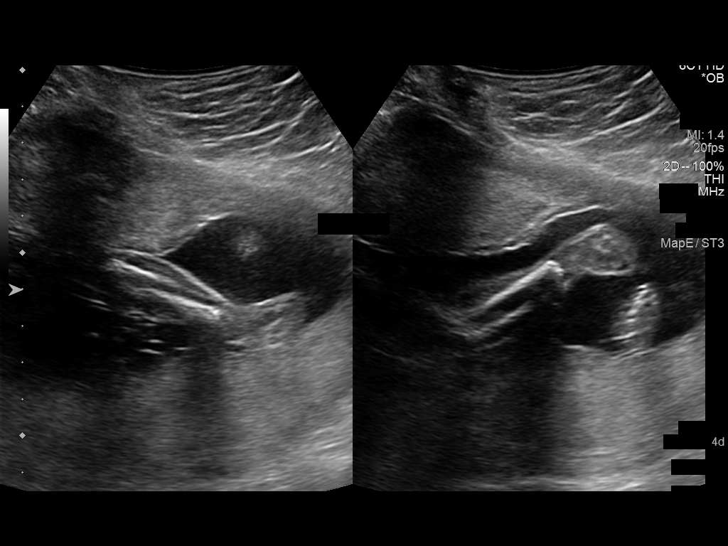
[im 78/105]
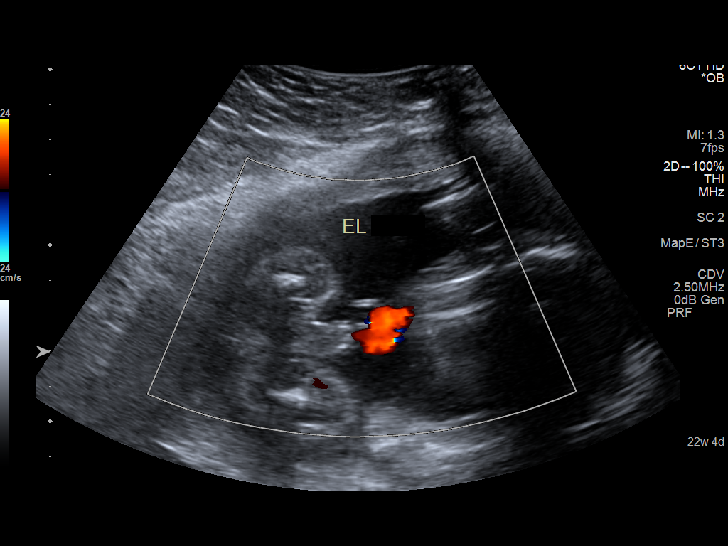
[im 85/105]
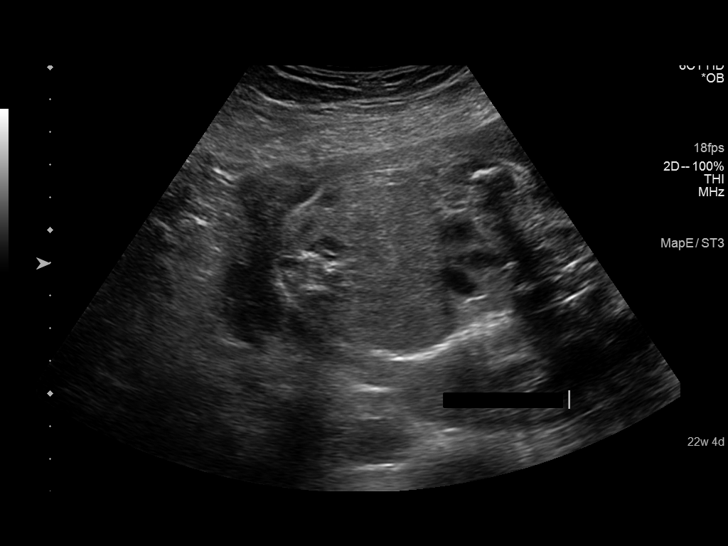
[im 93/105]
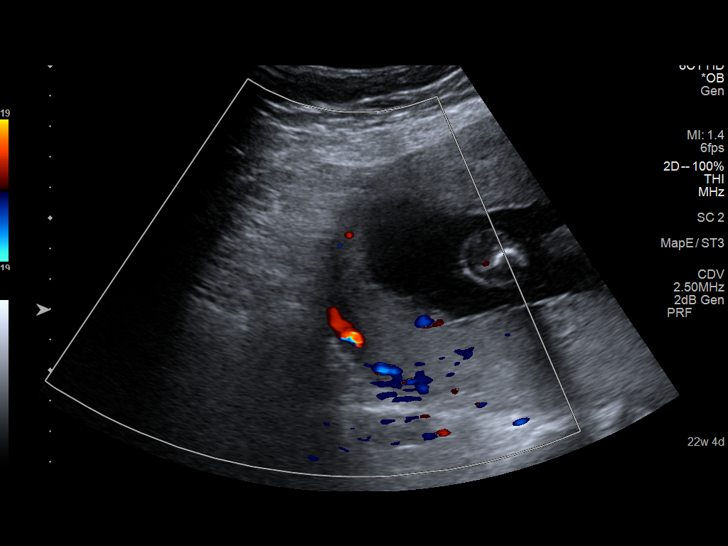
[im 101/105]
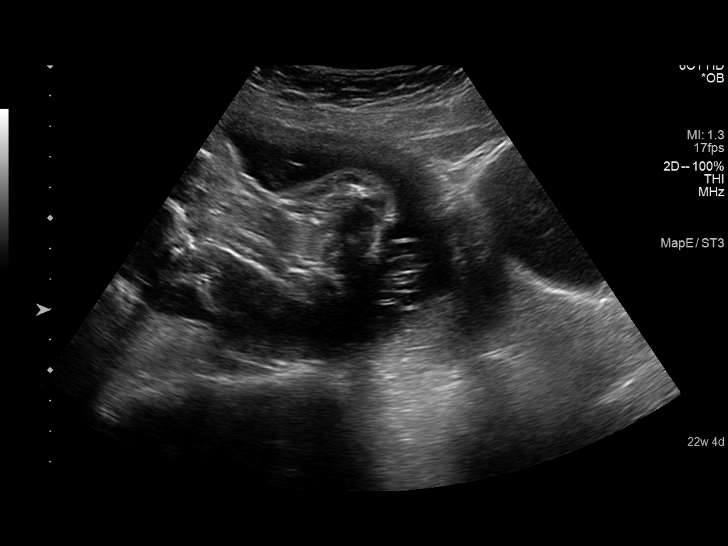

[13 of 28 positions shown; findings below may reference images not displayed]

FINDINGS: Number of Fetuses: 1

Heart Rate:  155 bpm

Movement: Yes

Presentation: Cephalic

Previa: No

Placental Location: Posterior

Amniotic Fluid (Subjective): Within normal limits

Amniotic Fluid (Objective):

Vertical pocket = 4.1cm

FETAL BIOMETRY

BPD: 5.4cm 22w 2d

HC:   19.9cm 22w 0d

AC:   18.1cm 23w 0d

FL:   3.8cm 22w 2d

Current Mean GA: 22w 2d US EDC: 02/01/2021

Assigned GA:  22w 4d Assigned EDC: 01/30/2021

FETAL ANATOMY

Lateral Ventricles: Appears normal

Thalami/CSP: Appears normal

Posterior Fossa:  Appears normal

Nuchal Region: Appears normal   NFT= N/A > 20 WKS

Upper Lip: Appears normal

Spine: Appears normal

4 Chamber Heart on Left: Appears normal

LVOT: Appears normal

RVOT: Appears normal

Stomach on Left: Appears normal

3 Vessel Cord: Appears normal

Cord Insertion site: Appears normal

Kidneys: Appears normal

Bladder: Appears normal

Extremities: Appears normal

Sex: Male

Maternal Findings:

Cervix:  4.8 cm TA
IMPRESSION: Assigned GA currently 22 weeks 4 days.  Appropriate fetal growth.

Unremarkable fetal anatomic survey.  No fetal anomalies identified.

## 2022-06-02 DIAGNOSIS — Z1389 Encounter for screening for other disorder: Secondary | ICD-10-CM | POA: Diagnosis not present

## 2022-06-02 DIAGNOSIS — Z309 Encounter for contraceptive management, unspecified: Secondary | ICD-10-CM | POA: Diagnosis not present

## 2022-07-17 ENCOUNTER — Ambulatory Visit: Payer: Medicaid Other | Admitting: Dermatology

## 2022-08-22 DIAGNOSIS — Z1331 Encounter for screening for depression: Secondary | ICD-10-CM | POA: Diagnosis not present

## 2022-08-22 DIAGNOSIS — Z1389 Encounter for screening for other disorder: Secondary | ICD-10-CM | POA: Diagnosis not present

## 2022-08-22 DIAGNOSIS — Z0000 Encounter for general adult medical examination without abnormal findings: Secondary | ICD-10-CM | POA: Diagnosis not present

## 2022-08-22 DIAGNOSIS — Z23 Encounter for immunization: Secondary | ICD-10-CM | POA: Diagnosis not present

## 2022-08-22 DIAGNOSIS — R319 Hematuria, unspecified: Secondary | ICD-10-CM | POA: Diagnosis not present

## 2022-08-22 DIAGNOSIS — M5440 Lumbago with sciatica, unspecified side: Secondary | ICD-10-CM | POA: Diagnosis not present

## 2022-08-29 DIAGNOSIS — R319 Hematuria, unspecified: Secondary | ICD-10-CM | POA: Diagnosis not present

## 2022-08-29 DIAGNOSIS — Z1389 Encounter for screening for other disorder: Secondary | ICD-10-CM | POA: Diagnosis not present

## 2022-08-29 DIAGNOSIS — Z309 Encounter for contraceptive management, unspecified: Secondary | ICD-10-CM | POA: Diagnosis not present

## 2022-10-03 DIAGNOSIS — Z112 Encounter for screening for other bacterial diseases: Secondary | ICD-10-CM | POA: Diagnosis not present

## 2022-10-03 DIAGNOSIS — B349 Viral infection, unspecified: Secondary | ICD-10-CM | POA: Diagnosis not present

## 2022-11-10 DIAGNOSIS — M545 Low back pain, unspecified: Secondary | ICD-10-CM | POA: Diagnosis not present

## 2022-11-24 DIAGNOSIS — M654 Radial styloid tenosynovitis [de Quervain]: Secondary | ICD-10-CM | POA: Diagnosis not present

## 2022-11-24 DIAGNOSIS — M545 Low back pain, unspecified: Secondary | ICD-10-CM | POA: Diagnosis not present

## 2023-09-03 DIAGNOSIS — Z1389 Encounter for screening for other disorder: Secondary | ICD-10-CM | POA: Diagnosis not present

## 2023-09-03 DIAGNOSIS — R110 Nausea: Secondary | ICD-10-CM | POA: Diagnosis not present

## 2023-09-03 DIAGNOSIS — Z3200 Encounter for pregnancy test, result unknown: Secondary | ICD-10-CM | POA: Diagnosis not present

## 2023-09-03 DIAGNOSIS — Z1331 Encounter for screening for depression: Secondary | ICD-10-CM | POA: Diagnosis not present

## 2023-09-03 DIAGNOSIS — R319 Hematuria, unspecified: Secondary | ICD-10-CM | POA: Diagnosis not present

## 2023-09-17 DIAGNOSIS — Z309 Encounter for contraceptive management, unspecified: Secondary | ICD-10-CM | POA: Diagnosis not present

## 2023-09-17 DIAGNOSIS — Z3200 Encounter for pregnancy test, result unknown: Secondary | ICD-10-CM | POA: Diagnosis not present

## 2023-10-18 DIAGNOSIS — Z1389 Encounter for screening for other disorder: Secondary | ICD-10-CM | POA: Diagnosis not present

## 2023-10-18 DIAGNOSIS — B079 Viral wart, unspecified: Secondary | ICD-10-CM | POA: Diagnosis not present

## 2023-11-14 DIAGNOSIS — R1013 Epigastric pain: Secondary | ICD-10-CM | POA: Diagnosis not present

## 2023-11-14 DIAGNOSIS — E559 Vitamin D deficiency, unspecified: Secondary | ICD-10-CM | POA: Diagnosis not present

## 2023-11-14 DIAGNOSIS — K900 Celiac disease: Secondary | ICD-10-CM | POA: Diagnosis not present

## 2023-11-14 DIAGNOSIS — Z1389 Encounter for screening for other disorder: Secondary | ICD-10-CM | POA: Diagnosis not present

## 2023-11-15 ENCOUNTER — Other Ambulatory Visit: Payer: Self-pay | Admitting: Family Medicine

## 2023-11-15 DIAGNOSIS — Z1231 Encounter for screening mammogram for malignant neoplasm of breast: Secondary | ICD-10-CM

## 2023-12-10 DIAGNOSIS — Z1389 Encounter for screening for other disorder: Secondary | ICD-10-CM | POA: Diagnosis not present

## 2023-12-10 DIAGNOSIS — K5900 Constipation, unspecified: Secondary | ICD-10-CM | POA: Diagnosis not present

## 2023-12-10 DIAGNOSIS — K649 Unspecified hemorrhoids: Secondary | ICD-10-CM | POA: Diagnosis not present

## 2023-12-25 DIAGNOSIS — N938 Other specified abnormal uterine and vaginal bleeding: Secondary | ICD-10-CM | POA: Diagnosis not present

## 2023-12-25 DIAGNOSIS — Z1389 Encounter for screening for other disorder: Secondary | ICD-10-CM | POA: Diagnosis not present

## 2023-12-25 DIAGNOSIS — Z3200 Encounter for pregnancy test, result unknown: Secondary | ICD-10-CM | POA: Diagnosis not present

## 2024-09-30 ENCOUNTER — Other Ambulatory Visit: Payer: Self-pay | Admitting: Family Medicine

## 2024-09-30 DIAGNOSIS — Z1231 Encounter for screening mammogram for malignant neoplasm of breast: Secondary | ICD-10-CM

## 2024-10-28 ENCOUNTER — Encounter
